# Patient Record
Sex: Female | Born: 2005 | Race: White | Hispanic: No | Marital: Single | Smoking: Never smoker
Health system: Southern US, Community
[De-identification: ages and names within clinical notes are randomized; demographics above are authoritative.]

---
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
## 2024-01-28 ENCOUNTER — Other Ambulatory Visit (HOSPITAL_COMMUNITY)
Admission: RE | Admit: 2024-01-28 | Discharge: 2024-01-28 | Disposition: A | Discharge status: Home / Self Care | Source: Ambulatory Visit | Attending: Adult Health | Admitting: Adult Health

## 2024-01-28 ENCOUNTER — Ambulatory Visit: Admitting: Adult Health

## 2024-01-28 ENCOUNTER — Encounter: Payer: Self-pay | Admitting: Adult Health

## 2024-01-28 VITALS — BP 120/82 | HR 73 | Ht 66.0 in | Wt 103.0 lb

## 2024-01-28 DIAGNOSIS — N921 Excessive and frequent menstruation with irregular cycle: Secondary | ICD-10-CM | POA: Insufficient documentation

## 2024-01-28 DIAGNOSIS — R1030 Lower abdominal pain, unspecified: Secondary | ICD-10-CM

## 2024-01-28 DIAGNOSIS — R319 Hematuria, unspecified: Secondary | ICD-10-CM | POA: Diagnosis not present

## 2024-01-28 DIAGNOSIS — M545 Low back pain, unspecified: Secondary | ICD-10-CM | POA: Diagnosis not present

## 2024-01-28 DIAGNOSIS — Z113 Encounter for screening for infections with a predominantly sexual mode of transmission: Secondary | ICD-10-CM

## 2024-01-28 LAB — POCT URINALYSIS DIPSTICK
Glucose, UA: NEGATIVE
Ketones, UA: NEGATIVE
Leukocytes, UA: NEGATIVE
Nitrite, UA: NEGATIVE
Protein, UA: NEGATIVE

## 2024-01-28 NOTE — Progress Notes (Signed)
  Subjective:     Patient ID: Hannah Blackburn, female   DOB: Apr 05, 2006, 18 y.o.   MRN: [id number]  HPI Lonni is a 18 year old white female, single G0P0, in complaining of irregular periods, has low back pain at times and low abdominal pain.    Review of Systems + irregular periods, has low back pain at times and low abdominal pain.     Reviewed past medical,surgical, social and family history. Reviewed medications and allergies.  Objective:   Physical Exam BP 120/82 (BP Location: Left Arm, Patient Position: Sitting)   Pulse 73   Ht 5\' 6"  (1.676 m)   Wt 103 lb (46.7 kg)   LMP 01/20/2024 (Exact Date)   BMI 16.62 kg/m  urine 3+blood   Skin warm and dry.Pelvic: external genitalia is normal in appearance no lesions, vagina: +blood,urethra has no lesions or masses noted, cervix:smooth, no CMT, uterus: normal size, shape and contour, non tender, no masses felt, adnexa: no masses or tenderness noted. Bladder is non tender and no masses felt. Has birth mark left leg.   Upstream - 01/28/24 1400       Pregnancy Intention Screening   Does the patient want to become pregnant in the next year? Yes    Does the patient's partner want to become pregnant in the next year? Yes    Would the patient like to discuss contraceptive options today? No      Contraception Wrap Up   Current Method Pregnant/Seeking Pregnancy    End Method Pregnant/Seeking Pregnancy            Examination chaperoned by Malachy Mood LPN  Assessment:     1. Lower abdominal pain UA C&S sent  - POCT Urinalysis Dipstick - Urine Culture - Urinalysis, Routine w reflex microscopic  2. Acute midline low back pain without sciatica UA C&S sent  - POCT Urinalysis Dipstick - Urine Culture - Urinalysis, Routine w reflex microscopic  3. Hematuria, unspecified type Urine sent for UA C&S  - Urine Culture - Urinalysis, Routine w reflex microscopic  4. Screening examination for STD (sexually transmitted disease) CV swab sent   - Cervicovaginal ancillary only( [place])  5. Irregular intermenstrual bleeding (Primary) Check QHCG  CV swab sent for GC/CHL,trich BV and yeast  - Cervicovaginal ancillary only( [place]) - Beta hCG quant (ref lab)     Plan:     Follow up prn

## 2024-01-29 LAB — BETA HCG QUANT (REF LAB): hCG Quant: <1 m[IU]/mL

## 2024-01-29 LAB — MICROSCOPIC EXAMINATION
Bacteria, UA: NONE SEEN
Casts: NONE SEEN /LPF

## 2024-01-29 LAB — URINALYSIS, ROUTINE W REFLEX MICROSCOPIC
Bilirubin, UA: NEGATIVE
Glucose, UA: NEGATIVE
Ketones, UA: NEGATIVE
Nitrite, UA: NEGATIVE
Specific Gravity, UA: 1.012 (ref 1.005–1.030)
Urobilinogen, Ur: 0.2 mg/dL (ref 0.2–1.0)
pH, UA: 7.5 (ref 5.0–7.5)

## 2024-02-01 LAB — URINE CULTURE

## 2024-02-02 ENCOUNTER — Other Ambulatory Visit: Payer: Self-pay | Admitting: Women's Health

## 2024-02-02 ENCOUNTER — Encounter: Payer: Self-pay | Admitting: Women's Health

## 2024-02-02 MED ORDER — NITROFURANTOIN MONOHYD MACRO 100 MG PO CAPS
100.0000 mg | ORAL_CAPSULE | Freq: Two times a day (BID) | ORAL | 0 refills | Status: AC
Start: 2024-02-02 — End: ?

## 2024-02-03 ENCOUNTER — Other Ambulatory Visit: Payer: Self-pay | Admitting: Women's Health

## 2024-02-03 ENCOUNTER — Encounter: Payer: Self-pay | Admitting: Women's Health

## 2024-02-03 LAB — CERVICOVAGINAL ANCILLARY ONLY
Bacterial Vaginitis (gardnerella): NEGATIVE
Candida Glabrata: NEGATIVE
Candida Vaginitis: POSITIVE — AB
Chlamydia: NEGATIVE
Comment: NEGATIVE
Comment: NEGATIVE
Comment: NEGATIVE
Comment: NEGATIVE
Comment: NEGATIVE
Comment: NORMAL
Neisseria Gonorrhea: NEGATIVE
Trichomonas: NEGATIVE

## 2024-02-03 MED ORDER — FLUCONAZOLE 150 MG PO TABS: 150.0000 mg | ORAL_TABLET | Freq: Once | ORAL | 0 refills | Status: AC
